# Patient Record
Sex: Male | Born: 1995 | Race: White | Hispanic: No | Marital: Single | Smoking: Current some day smoker
Health system: Southern US, Community
[De-identification: ages and names within clinical notes are randomized; demographics above are authoritative.]

---
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
## 2015-09-02 ENCOUNTER — Ambulatory Visit (INDEPENDENT_AMBULATORY_CARE_PROVIDER_SITE_OTHER): Payer: Self-pay | Admitting: Internal Medicine

## 2015-09-02 ENCOUNTER — Encounter: Payer: Self-pay | Admitting: Internal Medicine

## 2015-09-02 VITALS — BP 128/80 | HR 70 | Temp 98.0°F | Resp 18 | Ht 67.0 in | Wt 114.0 lb

## 2015-09-02 DIAGNOSIS — K0889 Other specified disorders of teeth and supporting structures: Secondary | ICD-10-CM

## 2015-09-02 MED ORDER — TRAMADOL HCL 50 MG PO TABS: 50.0000 mg | ORAL_TABLET | Freq: Four times a day (QID) | ORAL | Status: DC | PRN

## 2015-09-02 MED ORDER — AMOXICILLIN 500 MG PO TABS: 500.0000 mg | ORAL_TABLET | Freq: Three times a day (TID) | ORAL | Status: DC

## 2015-09-02 NOTE — Patient Instructions (Addendum)
JoyceJanna Civil [address]  [place]  [facility]    Dr. Christiana FuchsSharon Fuller [address] [place] [id number]  Dental Abscess A dental abscess is pus in or around a tooth. HOME CARE  Take medicines only as told by your dentist.  If you were prescribed antibiotic medicine, finish all of it even if you start to feel better.  Rinse your mouth (gargle) often with salt water.  Do not drive or use heavy machinery, like a lawn mower, while taking pain medicine.  Do not apply heat to the outside of your mouth.  Keep all follow-up visits as told by your dentist. This is important. GET HELP IF:  Your pain is worse, and medicine does not help. GET HELP RIGHT AWAY IF:  You have a fever or chills.  Your symptoms suddenly get worse.  You have a very bad headache.  You have problems breathing or swallowing.  You have trouble opening your mouth.  You have puffiness (swelling) in your neck or around your eye.   This information is not intended to replace advice given to you by your health care provider. Make sure you discuss any questions you have with your health care provider.   Document Released: 10/21/2014 Document Reviewed: 10/21/2014 Elsevier Interactive Patient Education [employer]2016 Elsevier Inc.

## 2015-09-02 NOTE — Progress Notes (Signed)
   Subjective:    Patient ID: Justin CovertJustin L Lamb, male    DOB: 28-Dec-1995, 20 y.o.   MRN: [id number]  Dental Pain  Pertinent negatives include no fever.  Patient presents to the office for evaluation of dental pain which has been bothering him for several months but got a lot worse yesterday.  He reports that he has not noticed any swelling.  The pain is in the upper right side.  He does not have any broken teeth or new teeth that are coming in right now.  Never had any dental work.  He does not go to the dentist.  No fevers or chills.  Gums do feel like they may be draining a little bit.      Review of Systems  Constitutional: Negative for fever, chills and fatigue.  HENT: Positive for dental problem. Negative for congestion, postnasal drip, rhinorrhea, sore throat, tinnitus and voice change.   Respiratory: Negative for chest tightness and shortness of breath.   Cardiovascular: Negative for chest pain and palpitations.       Objective:   Physical Exam  Constitutional: He is oriented to person, place, and time. He appears well-developed and well-nourished. No distress.  HENT:  Head: Normocephalic.  Mouth/Throat: Uvula is midline and oropharynx is clear and moist. No oral lesions. No trismus in the jaw. Abnormal dentition. No dental abscesses or uvula swelling. No oropharyngeal exudate.    Obvious overcrowding of teeth with large layer of plaque around the gumline of all teeth.  Right upper 3rd molar growing in laterally and crowing the 2nd molar.  3rd molar with central decay, large amounts of plaque and exposed pulp material.  No ginival abcess or redness.    Eyes: Conjunctivae are normal. No scleral icterus.  Neck: No JVD present. No thyromegaly present.  Pulmonary/Chest: Effort normal and breath sounds normal. No respiratory distress. He has no wheezes. He has no rales. He exhibits no tenderness.  Musculoskeletal: Normal range of motion.  Lymphadenopathy:    He has no cervical  adenopathy.  Neurological: He is alert and oriented to person, place, and time.  Skin: Skin is warm and dry. He is not diaphoretic.  Psychiatric: He has a normal mood and affect. His behavior is normal. Judgment and thought content normal.  Nursing note and vitals reviewed.  Filed Vitals:   09/02/15 1133  BP: 128/80  Pulse: 70  Temp: 98 F (36.7 C)  Resp: 18          Assessment & Plan:    1. Pain, dental -cannot rule out periapical abscess -no trismus or dental swelling noted -amoxicillin -tramadol -dental resources given -likely needs at least 2 teeth extracted and deep cleaning.   -likely gingivitis present.

## 2015-09-15 ENCOUNTER — Encounter: Payer: Self-pay | Admitting: Internal Medicine

## 2015-09-15 ENCOUNTER — Ambulatory Visit (INDEPENDENT_AMBULATORY_CARE_PROVIDER_SITE_OTHER): Payer: Self-pay | Admitting: Internal Medicine

## 2015-09-15 VITALS — BP 114/60 | HR 74 | Temp 98.0°F | Resp 16 | Ht 67.0 in | Wt 114.0 lb

## 2015-09-15 DIAGNOSIS — K056 Periodontal disease, unspecified: Secondary | ICD-10-CM

## 2015-09-15 MED ORDER — AMOXICILLIN 500 MG PO TABS: 500.0000 mg | ORAL_TABLET | Freq: Three times a day (TID) | ORAL | Status: DC

## 2015-09-15 NOTE — Progress Notes (Signed)
Assessment and Plan:   1. Chronic periodontal disease -amoxicillin 500 mg -recommended applying for medicaid so that he can get dental work done affordably.   -do recommend that he look in [facility] for deep cleaning.    HPI 19 y.o.male presents for 2 week follow-up dental pain.  He reports that the swelling is a little bit better but still not great.  He is still having some pain.  He did finish the amoxicillin until it is was gone.  He reports that the pain is now coming back again.  Patient reports that they have been doing well.  male is taking their medication.  They are not having difficulty with their medications.  They report no adverse reactions.  He is still dipping, but he has cut back a lot.  He reports that he cannot afford to go to the Dentist.    No trismus, fever, chills, sore throat, difficulty swallowing.    No past medical history on file.   Allergies  Allergen Reactions  . Nystatin       No current outpatient prescriptions on file prior to visit.   No current facility-administered medications on file prior to visit.    ROS: all negative except above.   Physical Exam: Filed Weights   09/15/15 1413  Weight: 114 lb (51.71 kg)   BP 114/60 mmHg  Pulse 74  Temp(Src) 98 F (36.7 C) (Temporal)  Resp 16  Ht 5\' 7"  (1.702 m)  Wt 114 lb (51.71 kg)  BMI 17.85 kg/m2 General Appearance: Well developed well nourished, non-toxic appearing in no apparent distress. Eyes: PERRLA, EOMs, conjunctiva w/ no swelling or erythema or discharge Sinuses: No Frontal/maxillary tenderness ENT/Mouth: Ear canals clear without swelling or erythema.  TM's normal bilaterally with no retractions, bulging, or loss of landmarks.  Poor dentition.  Severe gingival retraction of the canine tooth on bottom left.  Several molars with dental fractures and right wisdom tooth with swelling and gingival redness.   Neck: Supple, thyroid normal, no notable JVD  Respiratory: Respiratory effort  normal, Clear breath sounds anteriorly and posteriorly bilaterally without rales, rhonchi, wheezing or stridor. No retractions or accessory muscle usage. Cardio: RRR with no MRGs.   Abdomen: Soft, + BS.  Non tender, no guarding, rebound, hernias, masses.  Musculoskeletal: Full ROM, 5/5 strength, normal gait.  Skin: Warm, dry without rashes  Neuro: Awake and oriented X 3, Cranial nerves intact. Normal muscle tone, no cerebellar symptoms. Sensation intact.  Psych: normal affect, Insight and Judgment appropriate.     Terri Piedraourtney Forcucci, PA-C 2:35 PM [facility]

## 2015-09-15 NOTE — Patient Instructions (Signed)
[email or web address].gov/[email or web address]/by-[email or web address]/[email or web address]

## 2015-09-30 ENCOUNTER — Emergency Department (HOSPITAL_COMMUNITY)
Admission: EM | Admit: 2015-09-30 | Discharge: 2015-09-30 | Disposition: A | Discharge status: Home / Self Care | Payer: Self-pay | Attending: Emergency Medicine | Admitting: Emergency Medicine

## 2015-09-30 ENCOUNTER — Encounter (HOSPITAL_COMMUNITY): Payer: Self-pay | Admitting: *Deleted

## 2015-09-30 ENCOUNTER — Emergency Department (HOSPITAL_COMMUNITY): Payer: Self-pay

## 2015-09-30 DIAGNOSIS — R002 Palpitations: Secondary | ICD-10-CM | POA: Insufficient documentation

## 2015-09-30 DIAGNOSIS — R079 Chest pain, unspecified: Secondary | ICD-10-CM | POA: Insufficient documentation

## 2015-09-30 DIAGNOSIS — F17200 Nicotine dependence, unspecified, uncomplicated: Secondary | ICD-10-CM | POA: Insufficient documentation

## 2015-09-30 DIAGNOSIS — Z792 Long term (current) use of antibiotics: Secondary | ICD-10-CM | POA: Insufficient documentation

## 2015-09-30 LAB — BASIC METABOLIC PANEL
Anion gap: 10 (ref 5–15)
BUN: 11 mg/dL (ref 6–20)
CHLORIDE: 105 mmol/L (ref 101–111)
CO2: 27 mmol/L (ref 22–32)
CREATININE: 1.04 mg/dL (ref 0.61–1.24)
Calcium: 9.5 mg/dL (ref 8.9–10.3)
GFR calc Af Amer: >60 mL/min (ref >60)
GFR calc non Af Amer: >60 mL/min (ref >60)
Glucose, Bld: 81 mg/dL (ref 65–99)
POTASSIUM: 4 mmol/L (ref 3.5–5.1)
SODIUM: 142 mmol/L (ref 135–145)

## 2015-09-30 LAB — CBC
HEMATOCRIT: 45.5 % (ref 39.0–52.0)
Hemoglobin: 15.7 g/dL (ref 13.0–17.0)
MCH: 30.4 pg (ref 26.0–34.0)
MCHC: 34.5 g/dL (ref 30.0–36.0)
MCV: 88.2 fL (ref 78.0–100.0)
PLATELETS: 178 10*3/uL (ref 150–400)
RBC: 5.16 MIL/uL (ref 4.22–5.81)
RDW: 13 % (ref 11.5–15.5)
WBC: 4.7 10*3/uL (ref 4.0–10.5)

## 2015-09-30 LAB — I-STAT TROPONIN, ED: Troponin i, poc: 0 ng/mL (ref 0.00–0.08)

## 2015-09-30 MED ORDER — IBUPROFEN 800 MG PO TABS: 800.0000 mg | ORAL_TABLET | Freq: Three times a day (TID) | ORAL | Status: DC | PRN

## 2015-09-30 NOTE — Discharge Instructions (Signed)
Return here as needed.  Follow-up with your primary care doctor °

## 2015-09-30 NOTE — ED Notes (Signed)
Pt presents via POV c/o centralized chest pain describes as "pins and needles" x 2-3 months, some dizziness with pain, denies SOB/N/V.  Reports radiation to right arm intermiittently.  Pt a x 4, NAD.

## 2015-09-30 NOTE — ED Notes (Signed)
Patient transported to X-ray 

## 2015-09-30 NOTE — ED Provider Notes (Signed)
CSN: [id number]     Arrival date & time 09/30/15  0820 History   First MD Initiated Contact with Patient 09/30/15 [phone]     Chief Complaint  Patient presents with  . Chest Pain    HPI The patient is a 20 year old white male with no significant PMH who presents with migrating chest pain X3 months. He states the pain occurs at nighttime, mostly while resting, both right and left sided and substernal, endorses some palpitations. Some radiation down arms, depending where the pain presents itself. Denies syncope, LOC, lightheadedness, dizziness, N/V.  Copes with pain by smoking marijuana, also uses chewing tobacco daily and alcohol daily. Patient endorses stress at work and home, with minimal coping mechanisms aside from substances.   History reviewed. No pertinent past medical history. History reviewed. No pertinent past surgical history. No family history on file. Social History  Substance Use Topics  . Smoking status: Current Some Day Smoker  . Smokeless tobacco: None  . Alcohol Use: None    Review of Systems All ROS as above in HPI   Allergies  Nystatin  Home Medications   Prior to Admission medications   Medication Sig Start Date End Date Taking? Authorizing Provider  traMADol (ULTRAM) 50 MG tablet Take 50 mg by mouth every 6 (six) hours as needed for moderate pain.   Yes Historical Provider, MD  amoxicillin (AMOXIL) 500 MG tablet Take 1 tablet (500 mg total) by mouth 3 (three) times daily before meals. 09/15/15   Courtney Forcucci, PA-C   There were no vitals taken for this visit. Physical Exam  Constitutional: He is oriented to person, place, and time. He appears well-developed and well-nourished.  HENT:  Head: Normocephalic and atraumatic.  Poor dentition, moist mucus membranes  Eyes: EOM are normal. Pupils are equal, round, and reactive to light.  Neck: Normal range of motion. Neck supple.  Cardiovascular: Normal rate, regular rhythm, normal heart sounds and intact distal  pulses.  Exam reveals no gallop and no friction rub.   No murmur heard. Chest pain not reproducible, non tender chest to palpation  Pulmonary/Chest: Effort normal and breath sounds normal. No respiratory distress. He has no wheezes. He has no rales.  Abdominal: Soft. Bowel sounds are normal. He exhibits no distension. There is no tenderness.  Musculoskeletal: Normal range of motion.  Neurological: He is alert and oriented to person, place, and time. No cranial nerve deficit. He exhibits normal muscle tone. Coordination normal.  Skin: Skin is warm and dry. No rash noted. No erythema. No pallor.  Psychiatric: He has a normal mood and affect. His behavior is normal.  Nursing note and vitals reviewed.   ED Course  Procedures (including critical care time) Labs Review Results for orders placed or performed during the hospital encounter of 09/30/15  CBC  Result Value Ref Range   WBC 4.7 4.0 - 10.5 K/uL   RBC 5.16 4.22 - 5.81 MIL/uL   Hemoglobin 15.7 13.0 - 17.0 g/dL   HCT 11.945.5 14.739.0 - 82.952.0 %   MCV 88.2 78.0 - 100.0 fL   MCH 30.4 26.0 - 34.0 pg   MCHC 34.5 30.0 - 36.0 g/dL   RDW 56.213.0 13.011.5 - 86.515.5 %   Platelets 178 150 - 400 K/uL  I-stat troponin, ED  Result Value Ref Range   Troponin i, poc 0.00 0.00 - 0.08 ng/mL   Comment 3            Imaging Review No results found. I have  personally reviewed and evaluated these images and lab results as part of my medical decision-making.   MDM   The patient is a 20 year old white male, EKG ordered to r/o cardiac causes of chest pain. EKG nml and troponin's negative. S&s likely anxiety/stress related due to duration and nightly occurrence.   Patient is advised follow-up with his primary care Dr. told to return here as needed.  This chest discomfort could be multifactorial and could represent a combination of factors, it seems most likely, based on his history of present illness and physical exam anxiety could play a role in this as  well  Charlestine Night, PA-C 10/06/15 1557  Glynn Octave, MD 10/06/15 [phone]

## 2016-02-02 ENCOUNTER — Telehealth: Payer: Self-pay | Admitting: Physician Assistant

## 2016-02-02 ENCOUNTER — Telehealth: Payer: Self-pay

## 2016-02-02 MED ORDER — PERMETHRIN 5 % EX CREA: TOPICAL_CREAM | CUTANEOUS | 1 refills | Status: DC

## 2016-02-02 NOTE — Telephone Encounter (Signed)
Patient calling states that he has known exposure to scabies this past weekend and would like a cream sent in. Last seen in march, no future appointments. Set up 6 month CPE, will send in scabies cream, instructed to wash all clothes and linens. Follow up in the office if not better.

## 2016-02-02 NOTE — Telephone Encounter (Signed)
Spoke w/ pt to inform him that Marchelle Folksmanda has sent in a Rx for scabies & he need to schedule a CPE w/in the next 6mths. Pt agreed & was then transferred to front office for scheduling.

## 2016-03-09 ENCOUNTER — Encounter: Payer: Self-pay | Admitting: Physician Assistant

## 2017-03-15 ENCOUNTER — Encounter: Payer: Self-pay | Admitting: Physician Assistant

## 2020-06-20 ENCOUNTER — Encounter (HOSPITAL_COMMUNITY): Payer: Self-pay | Admitting: Emergency Medicine

## 2020-06-20 ENCOUNTER — Emergency Department (HOSPITAL_COMMUNITY)
Admission: EM | Admit: 2020-06-20 | Discharge: 2020-06-20 | Disposition: A | Discharge status: Home / Self Care | Payer: Self-pay | Attending: Emergency Medicine | Admitting: Emergency Medicine

## 2020-06-20 ENCOUNTER — Other Ambulatory Visit: Payer: Self-pay

## 2020-06-20 ENCOUNTER — Emergency Department (HOSPITAL_COMMUNITY): Payer: Self-pay

## 2020-06-20 DIAGNOSIS — T50901A Poisoning by unspecified drugs, medicaments and biological substances, accidental (unintentional), initial encounter: Secondary | ICD-10-CM

## 2020-06-20 DIAGNOSIS — F17200 Nicotine dependence, unspecified, uncomplicated: Secondary | ICD-10-CM | POA: Insufficient documentation

## 2020-06-20 DIAGNOSIS — T402X1A Poisoning by other opioids, accidental (unintentional), initial encounter: Secondary | ICD-10-CM | POA: Insufficient documentation

## 2020-06-20 DIAGNOSIS — F1910 Other psychoactive substance abuse, uncomplicated: Secondary | ICD-10-CM | POA: Insufficient documentation

## 2020-06-20 LAB — CBC WITH DIFFERENTIAL/PLATELET
Abs Immature Granulocytes: 0.14 10*3/uL — ABNORMAL HIGH (ref 0.00–0.07)
Basophils Absolute: 0.1 10*3/uL (ref 0.0–0.1)
Basophils Relative: 1 %
Eosinophils Absolute: 0.2 10*3/uL (ref 0.0–0.5)
Eosinophils Relative: 2 %
HCT: 49.9 % (ref 39.0–52.0)
Hemoglobin: 16.9 g/dL (ref 13.0–17.0)
Immature Granulocytes: 2 %
Lymphocytes Relative: 39 %
Lymphs Abs: 3.4 10*3/uL (ref 0.7–4.0)
MCH: 30.8 pg (ref 26.0–34.0)
MCHC: 33.9 g/dL (ref 30.0–36.0)
MCV: 91.1 fL (ref 80.0–100.0)
Monocytes Absolute: 0.7 10*3/uL (ref 0.1–1.0)
Monocytes Relative: 8 %
Neutro Abs: 4.1 10*3/uL (ref 1.7–7.7)
Neutrophils Relative %: 48 %
Platelets: 293 10*3/uL (ref 150–400)
RBC: 5.48 MIL/uL (ref 4.22–5.81)
RDW: 12.1 % (ref 11.5–15.5)
WBC: 8.6 10*3/uL (ref 4.0–10.5)
nRBC: 0 % (ref 0.0–0.2)

## 2020-06-20 LAB — COMPREHENSIVE METABOLIC PANEL
ALT: 30 U/L (ref 0–44)
AST: 24 U/L (ref 15–41)
Albumin: 4.6 g/dL (ref 3.5–5.0)
Alkaline Phosphatase: 64 U/L (ref 38–126)
Anion gap: 12 (ref 5–15)
BUN: 12 mg/dL (ref 6–20)
CO2: 22 mmol/L (ref 22–32)
Calcium: 8.7 mg/dL — ABNORMAL LOW (ref 8.9–10.3)
Chloride: 99 mmol/L (ref 98–111)
Creatinine, Ser: 1.36 mg/dL — ABNORMAL HIGH (ref 0.61–1.24)
GFR, Estimated: >60 mL/min (ref >60)
Glucose, Bld: 157 mg/dL — ABNORMAL HIGH (ref 70–99)
Potassium: 3.6 mmol/L (ref 3.5–5.1)
Sodium: 133 mmol/L — ABNORMAL LOW (ref 135–145)
Total Bilirubin: 0.3 mg/dL (ref 0.3–1.2)
Total Protein: 8.2 g/dL — ABNORMAL HIGH (ref 6.5–8.1)

## 2020-06-20 LAB — SALICYLATE LEVEL: Salicylate Lvl: <7 mg/dL — ABNORMAL LOW (ref 7.0–30.0)

## 2020-06-20 LAB — ETHANOL: Alcohol, Ethyl (B): 282 mg/dL — ABNORMAL HIGH (ref <10)

## 2020-06-20 LAB — ACETAMINOPHEN LEVEL: Acetaminophen (Tylenol), Serum: <10 ug/mL — ABNORMAL LOW (ref 10–30)

## 2020-06-20 MED ORDER — LACTATED RINGERS IV BOLUS
1000.0000 mL | Freq: Once | INTRAVENOUS | Status: AC
  Administered 2020-06-20: 1000 mL via INTRAVENOUS

## 2020-06-20 MED ORDER — NALOXONE HCL 4 MG/0.1ML NA LIQD: NASAL | 1 refills | Status: AC

## 2020-06-20 MED ORDER — SODIUM CHLORIDE 0.9 % IV BOLUS (SEPSIS)
1000.0000 mL | Freq: Once | INTRAVENOUS | Status: AC
  Administered 2020-06-20: 1000 mL via INTRAVENOUS

## 2020-06-20 NOTE — ED Notes (Signed)
Pt sats dropped to 88%. Pt had adequate respiratory effort. Placed pt on 2L [place]. MD notified.

## 2020-06-20 NOTE — ED Notes (Signed)
Pt ambulated around nurse's station without assistance and steady gait.

## 2020-06-20 NOTE — Discharge Instructions (Addendum)
Substance Abuse Treatment Programs ° °Intensive Outpatient Programs °[facility]     °[address]      °[place], [place]                   °[phone]      ° °[facility] °[address] °[place], [place] °[phone] ° °[facility]     °(Inpatient and outpatient)     °[address]           °[phone]   ° °[facility] °[phone] (Suboxone and Methadone) ° °[address]      °[place], [place] [address]      °[phone]      ° °[address] °[place], [place] °852-3033 ° °[facility] (Outpatient/Inpatient, Chemical)    °(insurance only) [phone]      °       °[facility] (Groups & Residential) °[place], [place] °[phone] ° °   °[facility]     °[address]     °[place], [place]      °[phone]      ° °[facility] (for caregivers and family) °[address] °[place], [place] °336-299-4655 ° ° ° ° ° °Residential Treatment Programs °[facility]      °[address], [place], [place] [address]  °[phone]      ° °[facility] °[address] [place], [place] [address] °[phone] ° °[facility]        °[phone]      ° °[facility] °[phone] ° °[facility]             °[address]                                         °[place], [place]                                                °[phone] or [phone]                              ° °[facility] [place] °[address] °[place], [address] °[phone] ° °[facility]    °[address]      °[place], [place]     °[phone]      ° °[facility] °[address] °[place], [place] °336-285-9073 ° °[facility]   °[address]     °[place], [place] [address]     °[phone]      °Admissions: 8am-3pm M-F ° °[facility] °[address] °[place],  [place] °336-227-7417 ° °[facility]: Residential Program (90 Days)   °[place],       °336-725-8389 or [phone]    ° °[facility] °[place],  °(Walk in Hours over the weekend or by referral) ° °[facility] °[address], [place],  [address] °([phone] ° °Crisis Mobile: [facility]:  1-877-626-1772 (for crisis response 24 hours a day) °[facility] Hotline:      1-800-256-2452 °Outpatient Psychiatry and Counseling ° °[facility]: Mobile Crisis   Management 24 hours:  1-877-626-1772 ° °[facility] sliding scale fee and walk in schedule: M-F 8am-12pm/1pm-3pm °[address]  °[place], [place] [address] °[phone] ° °[facility] °[address] °[place], [place] [address] °336-703-9650 ° °[facility] ([facility])- new patient walk-in appointments available Monday - Friday 8am -3pm.          °[address] °[place], [place] [address] °336-676-6840 or crisis line- 336-676-6905 ° °[facility] Intensive Outpatient Therapy Program °[address] °[place], [place] [address] °336-832-9804 ° °[facility]                  °Crisis Services      °[phone]      °[address]     °[place], [place] [address]                ° °[facility]   °[facility] °[phone] °[address] °[place], [place] [address] ° ° °[facility]          °[address]  °[place], [place] [address]       °[phone] ° °[facility] °[address] °[place], [place] [address] °336-292-1510 ° °[facility]    °[address]    °[place], [place] [address]     °[phone]      ° °Parish McKinney, MD     °[address]     °[place] [address]     °336-282-1251     °  °[facility] °[address] °[place] [address] ° °[facility]     °[address]     °[place], [place]      °336-542-2076      ° °[facility] °Shamsher Ahluwalia, MD °[address] °[place], [place] [address] °336-420-9558 ° °[facility]     °[address]     °[place], [place] [address]     °336-274-1237      ° °[facility] °[address] °[place], [place] [address] °336-854-4450 °Resources for Temporary Residential Assistance/Crisis Centers ° °DAY CENTERS °[facility] °M-F 8am-3pm   °[address] [place], [place] [address]   336-332-0824 °Services include: laundry, barbering, support groups, case management, phone  & computer access, showers, AA/NA mtgs, mental health/substance abuse nurse, job skills class, disability information, VA assistance, spiritual classes, etc.  ° °HOMELESS SHELTERS ° °[facility]     °[facility]   °[address], [place]     °[phone]       °       °[facility] (women and children)       °[address] °[place], [place] [address] °336-275-0820 °[email or web address] for application and process °Application Required ° °[facility]   °[address]    °[place] [address]     °[phone]       °             °[facility] °[address] °[place],  [address] °[phone] °336-235-0363(schedule application appt.) °Application Required ° °[facility] (women only)    °[address]     °[place],  [address]     °336-884-1039      °  Intake starts 6pm daily °Need valid ID, SSC, & Police report °[facility] [place] °[address] °[place], [place] °336-881-5420 °Application Required ° °[facility] (men only)     °[address]      °[place], [place]     °[phone]      ° °[facility] °(Pregnant women only) °[address] °[place], [place] °336-275-0206 ° °[facility]      °[address]      °[place], [place] [address]     °[phone]      °       °[facility] °[address] °[place], [place] °336-723-1848 °90 day commitment/SA/Application process ° °[facility](men only)     °[address]     °[place], [place]     °[phone]       °Check-in at 7pm     °       °[facility] °[address] °[place], [place] [address] °336-248-6684 °Men/Women/Women and Children must be there by 7 pm ° °[facility] °[place], [place] °336-722-8721                ° °
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
## 2020-06-20 NOTE — ED Triage Notes (Signed)
[facility] - pt stated he snorted some "white stuff" and 24 cans of beer. Pt went unresponsive while walking down the road, pt received 4mg  of Narcan IN by [facility].
